# Patient Record
Sex: Male | Born: 1945 | Race: White | Hispanic: No | Marital: Single
Health system: Southern US, Community
[De-identification: ages and names within clinical notes are randomized; demographics above are authoritative.]

## PROBLEM LIST (undated history)

## (undated) DIAGNOSIS — Z9049 Acquired absence of other specified parts of digestive tract: Secondary | ICD-10-CM

## (undated) DIAGNOSIS — N529 Male erectile dysfunction, unspecified: Secondary | ICD-10-CM

## (undated) DIAGNOSIS — Z9889 Other specified postprocedural states: Secondary | ICD-10-CM

## (undated) DIAGNOSIS — M1990 Unspecified osteoarthritis, unspecified site: Secondary | ICD-10-CM

---

## 1999-03-20 ENCOUNTER — Encounter (INDEPENDENT_AMBULATORY_CARE_PROVIDER_SITE_OTHER): Payer: Self-pay | Admitting: Specialist

## 1999-03-20 ENCOUNTER — Other Ambulatory Visit: Admission: RE | Admit: 1999-03-20 | Discharge: 1999-03-20 | Payer: Self-pay | Admitting: Plastic Surgery

## 2004-03-01 ENCOUNTER — Emergency Department (HOSPITAL_COMMUNITY): Admission: EM | Admit: 2004-03-01 | Discharge: 2004-03-01 | Payer: Self-pay | Admitting: Emergency Medicine

## 2004-03-04 ENCOUNTER — Inpatient Hospital Stay (HOSPITAL_COMMUNITY): Admission: EM | Admit: 2004-03-04 | Discharge: 2004-03-09 | Payer: Self-pay | Admitting: Emergency Medicine

## 2004-03-04 ENCOUNTER — Ambulatory Visit: Payer: Self-pay | Admitting: Internal Medicine

## 2004-03-30 ENCOUNTER — Ambulatory Visit: Payer: Self-pay | Admitting: Internal Medicine

## 2004-04-13 ENCOUNTER — Ambulatory Visit: Payer: Self-pay | Admitting: Internal Medicine

## 2004-05-20 ENCOUNTER — Ambulatory Visit: Payer: Self-pay | Admitting: Internal Medicine

## 2005-11-19 ENCOUNTER — Ambulatory Visit (HOSPITAL_COMMUNITY): Admission: RE | Admit: 2005-11-19 | Discharge: 2005-11-19 | Payer: Self-pay | Admitting: *Deleted

## 2005-11-19 ENCOUNTER — Encounter (INDEPENDENT_AMBULATORY_CARE_PROVIDER_SITE_OTHER): Payer: Self-pay | Admitting: *Deleted

## 2006-05-19 ENCOUNTER — Inpatient Hospital Stay (HOSPITAL_COMMUNITY): Admission: RE | Admit: 2006-05-19 | Discharge: 2006-05-20 | Payer: Self-pay | Admitting: Orthopedic Surgery

## 2008-07-28 ENCOUNTER — Inpatient Hospital Stay (HOSPITAL_COMMUNITY): Admission: EM | Admit: 2008-07-28 | Discharge: 2008-07-29 | Payer: Self-pay | Admitting: Emergency Medicine

## 2008-08-06 ENCOUNTER — Ambulatory Visit (HOSPITAL_COMMUNITY): Admission: RE | Admit: 2008-08-06 | Discharge: 2008-08-07 | Payer: Self-pay | Admitting: Surgery

## 2009-07-20 ENCOUNTER — Emergency Department: Payer: Self-pay | Admitting: Emergency Medicine

## 2009-10-18 ENCOUNTER — Inpatient Hospital Stay: Payer: Self-pay | Admitting: Internal Medicine

## 2009-11-02 ENCOUNTER — Inpatient Hospital Stay: Payer: Self-pay | Admitting: Internal Medicine

## 2010-09-06 ENCOUNTER — Emergency Department: Payer: Self-pay | Admitting: Emergency Medicine

## 2010-10-06 LAB — CK TOTAL AND CKMB (NOT AT ARMC)
CK, MB: 5.9 ng/mL — ABNORMAL HIGH (ref 0.3–4.0)
Relative Index: 1.9 (ref 0.0–2.5)
Relative Index: 1.9 (ref 0.0–2.5)

## 2010-10-06 LAB — CBC
HCT: 44.2 % (ref 39.0–52.0)
MCHC: 34.1 g/dL (ref 30.0–36.0)
MCV: 92.1 fL (ref 78.0–100.0)
Platelets: 117 10*3/uL — ABNORMAL LOW (ref 150–400)
Platelets: 136 10*3/uL — ABNORMAL LOW (ref 150–400)
RBC: 5.07 MIL/uL (ref 4.22–5.81)
WBC: 10 10*3/uL (ref 4.0–10.5)

## 2010-10-06 LAB — RAPID URINE DRUG SCREEN, HOSP PERFORMED
Amphetamines: NOT DETECTED
Barbiturates: NOT DETECTED
Benzodiazepines: NOT DETECTED

## 2010-10-06 LAB — BASIC METABOLIC PANEL
BUN: 11 mg/dL (ref 6–23)
CO2: 25 mEq/L (ref 19–32)
Calcium: 10.2 mg/dL (ref 8.4–10.5)
Calcium: 8.8 mg/dL (ref 8.4–10.5)
Chloride: 102 mEq/L (ref 96–112)
Creatinine, Ser: 1.32 mg/dL (ref 0.4–1.5)
GFR calc Af Amer: >60 mL/min (ref >60)
GFR calc Af Amer: >60 mL/min (ref >60)
GFR calc non Af Amer: >60 mL/min (ref >60)
Potassium: 3.4 mEq/L — ABNORMAL LOW (ref 3.5–5.1)

## 2010-10-06 LAB — POCT CARDIAC MARKERS
CKMB, poc: 3.4 ng/mL (ref 1.0–8.0)
Myoglobin, poc: 9.6 ng/mL — ABNORMAL LOW (ref 12–200)
Troponin i, poc: <0.05 ng/mL (ref 0.00–0.09)

## 2010-10-06 LAB — PROTIME-INR: Prothrombin Time: 14.3 seconds (ref 11.6–15.2)

## 2010-10-06 LAB — DIFFERENTIAL
Basophils Relative: 1 % (ref 0–1)
Eosinophils Absolute: 0.3 10*3/uL (ref 0.0–0.7)
Eosinophils Relative: 3 % (ref 0–5)
Lymphs Abs: 2.1 10*3/uL (ref 0.7–4.0)
Monocytes Absolute: 0.5 10*3/uL (ref 0.1–1.0)
Neutrophils Relative %: 69 % (ref 43–77)

## 2010-10-06 LAB — TROPONIN I: Troponin I: <0.01 ng/mL (ref 0.00–0.06)

## 2010-10-06 LAB — ETHANOL: Alcohol, Ethyl (B): 7 mg/dL (ref 0–10)

## 2010-10-06 LAB — TSH: TSH: 0.89 u[IU]/mL (ref 0.350–4.500)

## 2010-11-03 NOTE — Op Note (Signed)
[name], [name]                ACCOUNT NO.:  [id number]   MEDICAL RECORD NO.:  [id number]          PATIENT TYPE:  INP   LOCATION:  0006                         FACILITY:  [facility]   PHYSICIAN:  Ardeth Sportsman, MD     DATE OF BIRTH:  [birth year]   DATE OF PROCEDURE:  DATE OF DISCHARGE:                               OPERATIVE REPORT   PRIMARY CARE PHYSICIAN:  Georgianne Fick, M.D.   SURGEON/:  Ardeth Sportsman, MD.   ASSISTANT:  None.   PREOPERATIVE DIAGNOSIS:  Recurrent ventral hernia.   POSTOPERATIVE DIAGNOSIS:  Recurrent ventral hernia (7 x 8 cm  incarcerated with omentum and old mesh).   PROCEDURE PERFORMED:  Laparoscopic lysis of adhesions times 60 minutes,  laparoscopic ventral hernia repair with mesh (a 20 x 25 Proceed equals  ultra-lightweight polypropylene/cellulose mesh).   ANESTHESIA:  1. General anesthesia.  2. Local anesthetic in a field block around port sites.   SPECIMENS:  None.   DRAINS:  None.   ESTIMATED BLOOD LOSS:  Less than 30 mL.   COMPLICATIONS:  None apparent.   INDICATIONS:  Mr. Tory is a 65 year old gentleman who underwent an open  Nissen fundoplication by Angelia Mould. Derrell Lolling, M.D. in [phone] and has had  good control of his reflux symptoms.  He developed an incisional hernia  and had an open repair done a few years later in 1993.  This past year  he noted bulging in the upper part of his incision and it has increased  in size and become uncomfortable for him.  He has evidence of incisional  hernia.   The anatomy and physiology of abdominal wall formation was discussed.  Pathophysiology of incisional herniation with its risks of  incarceration, strangulation and debilitating pain and function was  discussed.  Options were discussed and recommendation was made for  laparoscopic lysis of adhesions with ventral hernia repair.  The risks,  benefits and alternatives were discussed in detail.  Postoperative pain  issues were discussed in detail as  well.  Questions were answered and he  agreed to proceed.   OPERATIVE FINDINGS:  He had a 7 x 8 cm recurrent hernia in the upper  midline incision.  He had a piece of well-peritonealized polypropylene  mesh stuck in the hernia, consistent with a avulsion of part of the  repair.  He had primarily greater omentum, but also some transverse  colon up within the hernia sac.  There was no injury or other  abnormalities.  There was no evidence of any transition point or bowel  obstruction.   DESCRIPTION OF PROCEDURE:  Informed consent was confirmed.  The patient  received 2 grams of IV cefazolin just prior to surgery.  He had  sequential pressure devices active during the entire case.  He underwent  general anesthesia without any difficulty.  He had a Foley catheter  sterilely placed.  His was positioned supine with both arms tucked.  His  abdomen and perineal region were clipped, prepped and draped in the  sterile fashion.   A 5 mm port was placed in the left  upper quadrant using optical entry  technique with the patient in steep reverse Trendelenburg and left-side  up.  Camera inspection revealed no intraabdominal injury.  Under direct  visualization a 10 mm port was placed in the right flank and another one  was placed in the right flank.   The patient had moderately wide wad of greater omentum and transverse  colon stuck in his upper abdomen.  This was carefully freed up by doing  some gentle sweeping of some thin wispy adhesions and sharp dissection,  primarily with cold scissors and occasionally with some focused cautery  when only the greater omentum was involved.  I did this and was able to  come around and find a hernia sac.  There was some dense adhesions of  primarily greater omentum up in the hernia sac near the obvious piece of  polypropylene mesh.  Eventually, I was able to free all that down.  I  ended up having freeing the liver edge, primarily the left hepatic lobe  off  the anterior parietal peritoneum that helped peel that down and  helped free up  anterior hepatic ligament as well.  Hemostasis was  assured.   The defect was measured out to be around 7 x 8 cm.  I choose a 20 x 25  cm mesh since it was a recurrence with mesh.  I ended up placing #1  Novofil stitches x10 around the edges of the rough side of the mesh.  The mesh was rolled rough side in and placed in the abdomen and  unrolled.  It was secured to the anterior abdominal wall using a  laparoscopic suture passer to get good full bites of the abdominal wall.  Care was made to not entrap any intercostal nerves.  Coverage was over 5  cm circumferentially around the hernia sac and the area was tacked down.  A tacker was used to secure the rough edges of the mesh to the anterior  abdominal wall.  There was no exposed abnormal mesh.   Camera inspection was done and hemostasis was excellent.  Capnoperitoneum was evacuated.  The ports were removed.  Please note  that the 10 mm port site had to be dilated to allow the mesh in and  therefore the defect was closed using #1 Novofil stitch figure-of-eight  x2 to help close the area down.  Capnoperitoneum was evacuated and  the ports were removed.  The skin was closed using 4-0 Monocryl stitch.  Sterile dressings were applied.  The patient was extubated and sent to  the recovery room in stable condition.   I discussed postoperative care with the patient in detail and I am about  to discuss it with his family.      Ardeth Sportsman, MD  Electronically Signed     SCG/MEDQ  D:  08/06/2008  T:  08/06/2008  Job:  [id number]   cc:   Angelia Mould. Derrell Lolling, M.D.  [address], [address]  [place]  [place] [address]   Georgianne Fick, M.D.  Fax: [phone]

## 2010-11-03 NOTE — H&P (Signed)
[name], [name]                ACCOUNT NO.:  [id number]   MEDICAL RECORD NO.:  [id number]          PATIENT TYPE:  INP   LOCATION:  2013                         FACILITY:  [facility]   PHYSICIAN:  Della Goo, M.D. DATE OF BIRTH:  [birth year]   DATE OF ADMISSION:  07/28/2008  DATE OF DISCHARGE:                              HISTORY & PHYSICAL   PRIMARY CARE PHYSICIAN:  Dr. Nicholos Johns.   CHIEF COMPLAINT:  Weakness, lightheadedness.   HISTORY OF PRESENT ILLNESS:  This is a 65 year old male who was brought  to the emergency department after almost passing out.  He states that he  began to feel weak at about 4 p.m. in the afternoon when he got up from  a chair and when he stood up he felt dizzy, so he sat back down and  rested for a while.  He then tried to get up again and was still dizzy  and feeling as if he would pass out.  He stated that he continued to  rest in the afternoon and took a nap and stated that when he got up  later he decided he was going to go out and went out to a club.  And  when he was leaving the club at about 10:00 p.m. he got up to leave and  almost passed out again.  The patient reported to the emergency  department physician that he had taken a Viagra tablet at about noon.  He states he also had two beers prior to going to the club.  The patient  stated that he also had symptoms of chest discomfort and diaphoresis  associated with the lightheadedness and also had nausea but no vomiting.  The EMS was called to the club, and the patient was taken to the  emergency department at [facility].  He was given 325 mg of  aspirin en route.   The patient reports having a similar episode many, many years ago in the  70's and states that he will went to the hospital at that time and the  cause was not found for the episode.  However, he states he never had  any further symptoms up until now.   PAST MEDICAL HISTORY:  History of hypoglycemia, osteoarthritis,  erectile  dysfunction, ventral hernia.   PAST SURGICAL HISTORY:  History of a Nissen fundoplication, which was  performed in 1993 secondary to severe reflux and vomiting.  Following  that surgery the patient developed a ventral hernia and had a ventral  hernia repair with mesh placement.  He is also scheduled to have a  repeat repair secondary to recurrence and failure of the mesh  reinforcement.  The patient has also had a right shoulder arthroplasty  in 2008, and appendectomy and a tonsillectomy and adenoidectomy.   MEDICATIONS:  Include trazodone 150 mg p.o. q.h.s., Viagra 100 mg p.o.  daily p.r.n., and Mobic 15 mg p.o. daily.   ALLERGIES:  NO KNOWN DRUG ALLERGIES.   SOCIAL HISTORY:  Patient is a smoker.  He reports quitting for  approximately 10 years and prior to that he had  smoked one pack per day  since the age of 89 and following quitting for 10 years he states he  began smoking again one pack per day up until the present.  Alcohol  usage:  He reports drinking 02/03 beers daily.   FAMILY HISTORY:  No coronary artery disease.  No hypertension.  No  diabetic disease. No cancers in the family that he knows of.   REVIEW OF SYSTEMS:  Pertinent as mentioned above.   PHYSICAL EXAMINATION FINDINGS:  GENERAL APPEARANCE:  This is a 62-year-  old well-nourished, well-developed male in no visible discomfort or  acute distress.  INITIAL VITAL SIGNS:  Temperature 97.1, blood pressure 93/53, heart rate  71, respirations 20, and O2 saturations 95-100%.  HEENT: Normocephalic, atraumatic.  Pupils are equally round and reactive  to light.  Extraocular movements are intact. Funduscopic benign.  There  is no scleral icterus.  There is scleral erythema.  Tympanic membranes  are clear bilaterally.  Nares are patent bilaterally.  Oropharynx is  clear.  No exudates or erythema. The neck is supple with full range of  motion.  No thyromegaly, adenopathy or jugular venous distention.   CARDIOVASCULAR: Regular rate and rhythm.  No murmurs, gallops or rubs  appreciated.  LUNGS: Clear to auscultation bilaterally.  No rales, rhonchi or wheezes.  ABDOMEN:  Two large linear scars. One scar is a surgical midline well-  healed area. There is a muscular defect just superior to the umbilicus.  Also there is another surgical scar which is adjacent and parallel to  this area which goes across the right lower quadrant area.  Positive  bowel sounds, soft, nontender, nondistended.  No hepatosplenomegaly.  EXTREMITIES: Without cyanosis, clubbing or edema.  NEUROLOGIC: Alert and oriented x3.  There are no focal deficits.   LABORATORY STUDIES:  White blood cell count 10, hemoglobin 15.9,  hematocrit 46.6, platelets 136, neutrophils 69%, lymphocytes 21%.  Sodium 139, potassium 3.8, chloride 102, bicarb 29, BUN 17, creatinine  1.32, glucose 161.  Chest x-ray reveals bibasilar atelectasis, and a  right humeral prosthesis is present.  EKGs performed in the emergency  department revealed a sinus rhythm.  However, both EKGs reveal a first-  degree AV block and the second EKG reveals also a Mobitz type 1 AV  block.   ASSESSMENT:  A 65 year old male being admitted with:  1. Presyncope.  2. Hypotension, which was possibly aggravated by Viagra.  3. Chest pain.  4. Arteriovenous block.  5. Hyperglycemia.  6. Mild thrombocytopenia.  7. Mild hyperglycemia.   PLAN:  The patient will be admitted to telemetry area for cardiac  monitoring.  Cardiac enzymes will be performed.  The patient has  received 2 liters of IV fluids in the emergency department and will  continue to receive fluids for fluid resuscitation.  Orthostatic vital  signs will be checked q.a.m.  His regular medications will not be  continued at this time.  The effects of the Viagra on his blood pressure  have been discussed with the patient.  But further evaluations will be  performed to evaluate for a possible cause of his  presyncope.  The  patient will be placed on DVT and GI prophylaxis.      Della Goo, M.D.  Electronically Signed    HJ/MEDQ  D:  07/28/2008  T:  07/28/2008  Job:  [id number]   cc:   [name], M.D.

## 2010-11-03 NOTE — Discharge Summary (Signed)
NAMECASHIUS, [name] NO.:  [id number]   MEDICAL RECORD NO.:  [id number]          PATIENT TYPE:  INP   LOCATION:  2013                         FACILITY:  [facility]   PHYSICIAN:  Eduard Clos, MDDATE OF BIRTH:  Apr 16, 1946   DATE OF ADMISSION:  07/28/2008  DATE OF DISCHARGE:  07/29/2008                               DISCHARGE SUMMARY   COURSE IN THE HOSPITAL:  A 65 year old male with history of arthralgia  was brought into ER when patient had an episode of dizziness.  The  patient had been to a pub and a few hours before that he had taken some  Viagra and alcohol.  The patient was admitted to the medical floor.  EKG  and cardiac enzymes were done.  EKG was showing positive  and cardiac  enzymes were within acceptable limits.  The patient was positive for  marijuana.  At this time, the patient's symptoms have greatly resolved.  There was no further episode of dizziness.  Cardiology was consulted.  As per Cardiology, no further workup was necessary and to have stress  test done as an outpatient on July 30, 2008, at their office.  The  patient was advised not to drive until then and to be off alcohol,  cigarette smoking, and Viagra.  At the time of this dictation, the  patient was noted to be stable .   1. Near syncope.  2. Arthralgia.  3. History of ventral hernia.   MEDICATIONS AT DISCHARGE:  1. Trazodone 50 mg p.o. at bedtime p.r.n.  2. Mobic 15 mg p.o. daily p.r.n. for arthralgia.  3. Omeprazole 40 mg p.o. daily.   The patient was advised not to take Viagra, alcohol, smoking cigarettes,  or using marijuana.  The patient was advised to follow up with [facility] on July 30, 2008, for his scheduled stress test.  He is to be off driving until cleared by Cardiology, to follow up with  Dr. Nicholos Johns, his primary care physician, within a week and recheck  his BMET.  Activity as tolerated.      Eduard Clos, MD  Electronically Signed     ANK/MEDQ  D:  07/29/2008  T:  07/30/2008  Job:  [id number]   cc:   Georgianne Fick, M.D.

## 2010-11-06 NOTE — Op Note (Signed)
NAMEDECARI, DUGGAR                ACCOUNT NO.:  [id number]   MEDICAL RECORD NO.:  [id number]          PATIENT TYPE:  INP   LOCATION:  5038                         FACILITY:  [facility]   PHYSICIAN:  Vania Rea. Supple, M.D.  DATE OF BIRTH:  July 22, 1945   DATE OF PROCEDURE:  05/19/2006  DATE OF DISCHARGE:                               OPERATIVE REPORT   PREOP DIAGNOSIS:  End-stage right shoulder glenohumeral joint  osteoarthrosis.   POSTOP DIAGNOSIS:  End-stage right shoulder glenohumeral joint  osteoarthrosis.   PROCEDURE:  Right shoulder Press-Fit hemiarthroplasty utilizing a DePuy  global stem size 12 and a 56 x 18 offset humeral head.   SURGEON OF RECORD:  Vania Rea. Supple, M.D.   Threasa HeadsFrench Ana A. Shuford, P.A.-C.   ANESTHESIA:  General endotracheal as well as her preop interscalene  block.   ESTIMATED BLOOD LOSS:  400 mL.   DRAINS:  None.   HISTORY:  Mr. Maione is a 65 year old gentleman who has had chronic right  shoulder pain secondary to osteoarthrosis with now significant increase  in functional limitations.  Due to his longstanding symptoms and  increasing pain and functional limitations, he is brought to the  operating room, at this time, for planned right shoulder  hemiarthroplasty.   Preoperatively I counseled Mr. Clayborn on treatment options as well as  risks versus benefits thereof.  Possible surgical complications of  bleeding, infection, neurovascular injury, persistence of pain, loss of  motion, loosening of the implant, and possible need for revision surgery  were all reviewed.  He understands, accepts, and agrees with our planned  procedure.   PROCEDURE IN DETAIL:  After undergoing routine preop evaluation, the  patient received prophylactic antibiotics.  An interscalene block was  established in the holding area by the anesthesia department.  Patient  placed supine on the operating table, and underwent smooth induction of  a general endotracheal  anesthesia.  The right shoulder girdle region was  then sterilely prepped and draped in standard fashion.  A standard  anterior approach to the right shoulder was made through a deltopectoral  interval, with an incision beginning just proximal to the coracoid  process and extending laterally and distally to a length of  approximately 20 cm.  The skin was sharply divided.  The deltopectoral  interval was identified; and the cephalic vein was carefully protected,  and retracted laterally with the deltoid.   I performed a small tenotomy in the upper margin of the pectoralis  major.  The conjoined tendon was then identified and a self-retaining  retractor was then placed to retract the conjoined tendon medially, and  the deltoid laterally.  The biceps tendon was then identified, and then  the bicipital sheath was divided; and the biceps tendon was then  unroofed; and this allowed visualization of the insertion of the  subscapularis which was sharply divided away from its insertion into the  lesser tuberosity; and the free margin was then tacked with a series of  #2 FiberWire sutures.  The anterior circumflex vessels were then  identified and divided with electrocautery.  This allowed  free  mobilization of the subscapularis medially.  We protected the contents  of the axillary pouch; and then dissected the capsule away from the  inferior margin of the humeral head dissecting it away from very large  inferior osteophytes; and the osteophytes were then removed with a  combination of rongeur and osteotome.   The subscapularis was then placed behind the retractor and retracted  medially.  This allowed visualization of the glenoid articular surfaces  which were, actually, in quite good condition.  The biceps tendon was  then tenotomized to the superior glenoid; and then we sharply removed  the remaining aspects of the glenoid labrum.  The anterior capsule was  divided to allow mobilization of the  subscapularis.  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  We meticulously  debrided the capsule anteriorly, inferiorly, and posteriorly.   Rotator cuff was noted be intact superiorly and posteriorly.  This was  carefully protected.  The humeral head was then exposed; and then using  an extramedullary guide, we made the osteotomy across the humeral head  with an oscillating saw with care taken to protect the rotator cuff.  The head segment was then removed and the size was between 56 x 15 and  56 x 18.  The humeral head was then delivered through the wound; and we  used an intramedullary drill to gain access to the humeral canal, and  then performed sequential hand reaming up to size 12.  We then placed  the initial box cutting guide on the face of the humeral head cut, and  determined proper degree of retroversion; and then box cut the humerus.  We then performed sequential broachings up to a size 12.  Excellent fit  was achieved.  We then performed a trial reduction with a 56 x 15 and 56  x 18 heads and the 56 x 18 with an offset provided the best coverage of  the proximal humerus.   The trial components were then removed.  The canal was irrigated; and we  placed a series of bone tunnels through the anterior humerus for later  repair of the subscapularis.  The final implant was then impacted into  position.  We did place some bone graft proximally around the humeral  stem.  Excellent impaction fit was achieved.  The final humeral head  implant 56 x 18 offset, was then placed into position.  Proper location  of the head was dialed in; and then it was impacted into position.  Final reduction was performed.  There was excellent shoulder motion  with good soft tissue balance.  We then irrigated the joint, obtained  hemostasis.  The subscapularis was then completely and circumferentially  mobilized; and then repaired through bone tunnels to the lesser  tuberosity.  We then performed a biceps tenodesis to the suture limbs  from the subscapularis repair.  At this point, final inspection and  irrigation was completed.  The deltopectoral interval was then allowed  to close.  Then 2-0 Vicryl was then used to close the subcutaneous layer  and intracuticular 3-0 Monocryl was used to close the skin; followed by  Steri-Strips.  A dry dressing was placed over the right shoulder; and  the right arm placed in a sling  immobilizer.  The patient was then  extubated, awakened, and taken to the recovery room in stable position.      Vania Rea. Supple, M.D.  Electronically Signed     KMS/MEDQ  D:  05/19/2006  T:  05/19/2006  Job:  [id number]

## 2010-11-06 NOTE — Op Note (Signed)
NAMEKEVIN, SPACE                ACCOUNT NO.:  [id number]   MEDICAL RECORD NO.:  [id number]          PATIENT TYPE:  AMB   LOCATION:  ENDO                         FACILITY:  [facility]   PHYSICIAN:  Georgiana Spinner, M.D.    DATE OF BIRTH:  1946-01-25   DATE OF PROCEDURE:  11/19/2005  DATE OF DISCHARGE:                                 OPERATIVE REPORT   PROCEDURE:  Colonoscopy with biopsy.   INDICATIONS:  Colon polyp.   ANESTHESIA:  Demerol 50, Versed 7 mg.   PROCEDURE:  With the patient mildly sedated in the left lateral decubitus  position, a rectal examination was performed which was unremarkable.  Subsequently the Olympus videoscopic colonoscope was inserted into the  rectum and passed under direct vision through a slightly tortuous sigmoid  colon.  We reached the cecum, identified by ileocecal valve and appendiceal  orifice, both of which were photographed. From this point the colonoscope  was slowly withdrawn taking circumferential views of colonic mucosa stopping  first at the hepatic flexure where a lipoma that was approximately 2 cm in  size was seen photographed only.  The endoscope was then further withdrawn  taking circumferential views of remaining colonic mucosa stopping then only  in the rectum at the anal verge where a polyp was seen. It was approximately  7 mm to 8 mm in size.  It was photographed and was removed using hot biopsy  forceps technique, setting of 20/200 blended current.  The endoscope was  then placed in retroflexion to view the anal canal from above.  Internal  hemorrhoids and the site of the previous polyp were both noted.  The  endoscope was straightened and withdrawn.  The patient's vital signs, pulse  oximeter remained stable.  The patient tolerated procedure well without  apparent complication.   FINDINGS:  Lipoma of hepatic flexure area, polyp of rectum near anal verge.   PLAN:  Await biopsy report.  The patient call me for results and follow-up  with me as an outpatient.           ______________________________  Georgiana Spinner, M.D.     GMO/MEDQ  D:  11/19/2005  T:  11/19/2005  Job:  [id number]

## 2010-11-06 NOTE — Discharge Summary (Signed)
[name], SCHUBRING                ACCOUNT NO.:  [id number]   MEDICAL RECORD NO.:  [id number]          PATIENT TYPE:  INP   LOCATION:  5740                         FACILITY:  [facility]   PHYSICIAN:  Alvester Morin, M.D.  DATE OF BIRTH:  12-26-1945   DATE OF ADMISSION:  03/04/2004  DATE OF DISCHARGE:  03/09/2004                                 DISCHARGE SUMMARY   DISCHARGE DIAGNOSES:  1.  Methicillin-resistant Staphylococcus aureus cellulitis.  2.  Osteoarthritis.   DISCHARGE MEDICATIONS:  1.  Acetaminophen 325 mg two tablets q.4h. p.r.n. pain.  2.  Mobic 15 mg daily.  3.  Doxycycline 100 mg b.i.d.   DISPOSITION:  Patient discharged to home with appointment scheduled in  outpatient clinic with Dr. [name] on October 10 at 9 a.m.  He is to have a  CBC with differential drawn at that time.   PROCEDURES:  1.  PICC placement.  2.  CT scan that was negative for abscess, showing cellulitis.   CONSULTS:  1.  Surgery, Dr. Gerrit Friends  2.  Nurse case manager   HISTORY AND PHYSICAL:  This is a 65 year old man with no significant past  medical history who presents with left leg erythema and pain.  He reports  noticing a red dot on his left upper thigh nine days prior to admission.  Within a few days he reported that there was a black dot in the center of  the red area.  At that point there was itching.  Four days prior to  admission the patient presented to an ER in [place] where he was given  __________.  The next day the erythema had spread and the area was painful.  He went to the ER at [facility] where the area was drained and received  __________.  The pain was worse one day prior to admission so patient  reported to dermatologist who took a culture and put the patient on  Levaquin.  The culture is pending but the Gram stain showed few white blood  cells, moderate gram-positive cocci in clusters, and rare squamous  epithelial cells.  The patient reports fever increasing to 102.8 and  chills.  He denies any trauma to his leg, any cough, any pleuritic chest pain.  He  also denies having been in any wooded area.   PHYSICAL EXAMINATION:  VITAL SIGNS:  Temperature 100.8, pulse 100,  respiratory rate 22, blood pressure 120/75.  He was saturating 96% on room  air.  GENERAL:  No acute distress, sleepy.  HEENT:  Eyes:  Pupils were equally round and reactive to light.  Extraocular  movement intact.  NECK:  Supple.  No thyromegaly.  No JVD.  No bruits.  RESPIRATORY:  Clear to auscultation bilaterally.  CARDIOVASCULAR:  Regular rate and rhythm.  No murmurs, rubs, or gallops.  GASTROINTESTINAL:  Soft, plus bowel sounds, nontender, nondistended with  left lower quadrant tender to palpation, but no rebound or guarding.  SKIN:  Patient had left thigh erythema from his waist to his mid leg.  There  was increased warmth with margins not well demarcated.  There was a bandage  at his upper lateral thigh draining a serosanguineous fluid.  Medial to the  bandage was a cluster of white papules.  The area of erythema blanches.  Patient also had a vertically midline abdominal scar and a left lower  abdominal horizontal scar.  LYMPH:  Patient with painful and firm inguinal lymphadenopathy on his left  side.  No submandibular or supraclavicular adenopathy.  NEUROLOGIC:  Alert and oriented x3.  Babinski was negative.   ADMISSION LABORATORIES:  Patient with a white count of 16.1, hemoglobin and  hematocrit 15.2 and 44.4, platelets 181, absolute neutrophil count 13.1, MCV  89.  CMET:  Sodium 137, potassium 4.1, chloride 102, bicarbonate 27, BUN 15,  creatinine 1.3, glucose 81.  Patient had a bilirubin of 1, alkaline  phosphatase of 80, AST of 46, ALT of 52, protein 7.5, albumin 3.7, calcium  10.   HOSPITAL COURSE:  PROBLEM 1 - CELLULITIS:  Patient was started on vancomycin  secondary to concerns of MRSA and wound cultures as well as blood cultures  were taken.  During his course his white  blood count steadily declined and  he was afebrile.  His wound had increased drainage and surgery was consulted  secondary to concerns of an abscess.  Dr. Gerrit Friends did not feel that it was an  abscess or that there was anything to I&D.  Patient continued to improve  clinically with decreasing erythema, warmth, and drainage.  His culture  results came back and MRSA was confirmed with sensitivities to vancomycin,  levofloxacin, and tetracycline.  Patient was discontinued on vancomycin as  well as had PICC discontinued and begun on oral doxycycline at discharge.   PROBLEM 2 - ELEVATED LIVER FUNCTION TESTS:  Resolved.  Probably secondary to  infection.   DISCHARGE LABORATORIES:  Patient's CBC was as follows:  His white count was  8.8, hemoglobin 11.8 with hematocrit of 34.2, platelets 189, MCV 88.1.       GB/MEDQ  D:  03/10/2004  T:  03/10/2004  Job:  [id number]   cc:   Velora Heckler, M.D.  [address] [place]  [place] [address]  Fax: [phone]   Outpatient Clinic

## 2010-11-06 NOTE — Consult Note (Signed)
NAME:  [name], [name]                          ACCOUNT NO.:  [id number]   MEDICAL RECORD NO.:  [id number]                   PATIENT TYPE:  INP   LOCATION:  5740                                 FACILITY:  [facility]   PHYSICIAN:  Velora Heckler, M.D.                DATE OF BIRTH:  12/01/1945   DATE OF CONSULTATION:  03/05/2004  DATE OF DISCHARGE:                                   CONSULTATION   REFERRING PHYSICIAN:  Medical teaching service, Dr. Harriett Sine Phifer attending   REASON FOR CONSULTATION:  Left lower extremity cellulitis with associated  wound.   HISTORY OF PRESENT ILLNESS:  Patient is a 65 year old white male admitted on  the medical teaching service from the emergency department with cellulitis  left lower extremity.  Patient was in his normal state of good health until  10 days ago when he noted a small area on the left lateral upper thigh.  This was initially read in color.  He had progressed to a dark brown/black  area with ulceration.  He was traveling in [place] at the time and was  seen in an emergency room where he was started on antibiotics.  He returned  to the emergency department where it was noted to have increased redness and  increased tenderness.  Patient returned to [facility] and was seen in the  emergency department at [facility].  He underwent incision and  drainage in the emergency department and was started on some type of Sulfa  antibiotic.  Patient failed to improve and was seen by dermatologist.  Dermatologist then referred the patient to [facility] where he was admitted  on the medical teaching service on March 04, 2004.  Patient was then  started on intravenous vancomycin.  Further cultures were taken from the  wound.  Gram stain showed gram-positive cocci in clusters.  General surgery  is now consulted for consideration for incision and drainage if needed.   PAST MEDICAL HISTORY:  1.  Hypoglycemia.  2.  Osteoarthritis.  3.  Status  post appendectomy.  4.  Status post nissen fundoplication in 1993 by Dr. Claud Kelp.  5.  Status post ventral hernia repair.  6.  History of traumatic fracture of the pelvis 1995.   MEDICATIONS:  Mobic.   ALLERGIES:  None known.   SOCIAL HISTORY:  Patient is married.  He does concrete work.  He is  accompanied by his wife.  He smokes a pack of cigarettes a day.  He drinks  alcohol on occasion.   REVIEW OF SYSTEMS:  15-system reviewed and documented in the medical record  and reviewed with the patient.  No other significant positives noted.   PHYSICAL EXAMINATION:  GENERAL:  A 65 year old white male on [address] [facility].  No acute distress.  VITAL SIGNS:  Temperature 97.4, pulse 66, respirations 20, blood pressure  95/48, O2 saturation 95% room air.  HEENT:  Normocephalic.  Sclerae are clear.  Conjunctivae are clear.  Dentition is fair.  Mucous membranes are moist.  Voice is normal.  EXTREMITIES:  Examination of the left lower extremity shows extensive  cellulitis from the inguinal crease extending to the mid thigh anteriorly.  This also extends medially near the scrotum and laterally to the greater  trochanter.  There is an open draining wound laterally in the proximal  thigh.  It measures approximately 1 cm in diameter.  It has brown purulent  drainage present on the dressing.  Dressing is changed.  There is palpable  lymphadenopathy in the left groin which is moderately tender.  There is  marked induration.  There is no fluctuance.   LABORATORIES:  White count 12.3, hemoglobin 12.5, platelet count 176,000.  Electrolytes are normal.  Liver function tests are normal.  Cultures and  microbiology are pending.   Radiographic studies:  CT scan left lower extremity performed this morning  shows edema and inflammatory changes throughout the left proximal thigh with  thickening of the skin.  There is no drainable abscess identified.   IMPRESSION:  Probable  methicillin-resistant Staphylococcus aureus infection  of left lower extremity with resultant cellulitis.  No evidence of drainable  abscess.   PLAN:  1.  Continue local wound care.  2.  Intravenous vancomycin.  3.  No role for surgical intervention at present.  Will follow closely with      you.                                               Velora Heckler, M.D.    TMG/MEDQ  D:  03/05/2004  T:  03/06/2004  Job:  [id number]

## 2011-02-03 ENCOUNTER — Emergency Department (HOSPITAL_COMMUNITY): Payer: BC Managed Care – PPO

## 2011-02-03 ENCOUNTER — Emergency Department (HOSPITAL_COMMUNITY)
Admission: EM | Admit: 2011-02-03 | Discharge: 2011-02-03 | Disposition: A | Discharge status: Home / Self Care | Payer: BC Managed Care – PPO | Attending: Emergency Medicine | Admitting: Emergency Medicine

## 2011-02-03 ENCOUNTER — Encounter (HOSPITAL_COMMUNITY): Payer: Self-pay

## 2011-02-03 DIAGNOSIS — S02109A Fracture of base of skull, unspecified side, initial encounter for closed fracture: Secondary | ICD-10-CM | POA: Insufficient documentation

## 2011-02-03 DIAGNOSIS — S022XXA Fracture of nasal bones, initial encounter for closed fracture: Secondary | ICD-10-CM | POA: Insufficient documentation

## 2011-02-03 DIAGNOSIS — M129 Arthropathy, unspecified: Secondary | ICD-10-CM | POA: Insufficient documentation

## 2011-02-03 DIAGNOSIS — R221 Localized swelling, mass and lump, neck: Secondary | ICD-10-CM | POA: Insufficient documentation

## 2011-02-03 DIAGNOSIS — R220 Localized swelling, mass and lump, head: Secondary | ICD-10-CM | POA: Insufficient documentation

## 2011-02-03 DIAGNOSIS — S0280XA Fracture of other specified skull and facial bones, unspecified side, initial encounter for closed fracture: Secondary | ICD-10-CM | POA: Insufficient documentation

## 2011-02-03 DIAGNOSIS — S0003XA Contusion of scalp, initial encounter: Secondary | ICD-10-CM | POA: Insufficient documentation

## 2011-02-03 DIAGNOSIS — M79609 Pain in unspecified limb: Secondary | ICD-10-CM | POA: Insufficient documentation

## 2011-02-03 DIAGNOSIS — R51 Headache: Secondary | ICD-10-CM | POA: Insufficient documentation

## 2011-02-03 DIAGNOSIS — M542 Cervicalgia: Secondary | ICD-10-CM | POA: Insufficient documentation

## 2011-02-03 DIAGNOSIS — IMO0002 Reserved for concepts with insufficient information to code with codable children: Secondary | ICD-10-CM | POA: Insufficient documentation

## 2011-02-03 DIAGNOSIS — S0083XA Contusion of other part of head, initial encounter: Secondary | ICD-10-CM | POA: Insufficient documentation

## 2011-02-03 HISTORY — DX: Male erectile dysfunction, unspecified: N52.9

## 2011-02-03 HISTORY — DX: Acquired absence of other specified parts of digestive tract: Z90.49

## 2011-02-03 HISTORY — DX: Unspecified osteoarthritis, unspecified site: M19.90

## 2011-02-03 HISTORY — DX: Other specified postprocedural states: Z98.890

## 2011-02-03 LAB — POCT I-STAT, CHEM 8
Chloride: 102 mEq/L (ref 96–112)
Glucose, Bld: 127 mg/dL — ABNORMAL HIGH (ref 70–99)
HCT: 52 % (ref 39.0–52.0)
Hemoglobin: 17.7 g/dL — ABNORMAL HIGH (ref 13.0–17.0)
Potassium: 4.1 mEq/L (ref 3.5–5.1)

## 2011-02-18 ENCOUNTER — Ambulatory Visit: Payer: Self-pay | Admitting: Otolaryngology

## 2011-10-14 ENCOUNTER — Ambulatory Visit: Payer: Self-pay | Admitting: Otolaryngology

## 2011-10-18 LAB — PATHOLOGY REPORT

## 2012-06-21 DEATH — deceased

## 2012-11-19 IMAGING — CR DG CHEST 2V
2 series · 2 of 2 positions shown · non-contrast
Comparison: 07/28/2008

CLINICAL DATA: Trauma, rolled a lawnmower, neck and face pain,
smoker, hypertension

CHEST - 2 VIEW

[w chest pa]
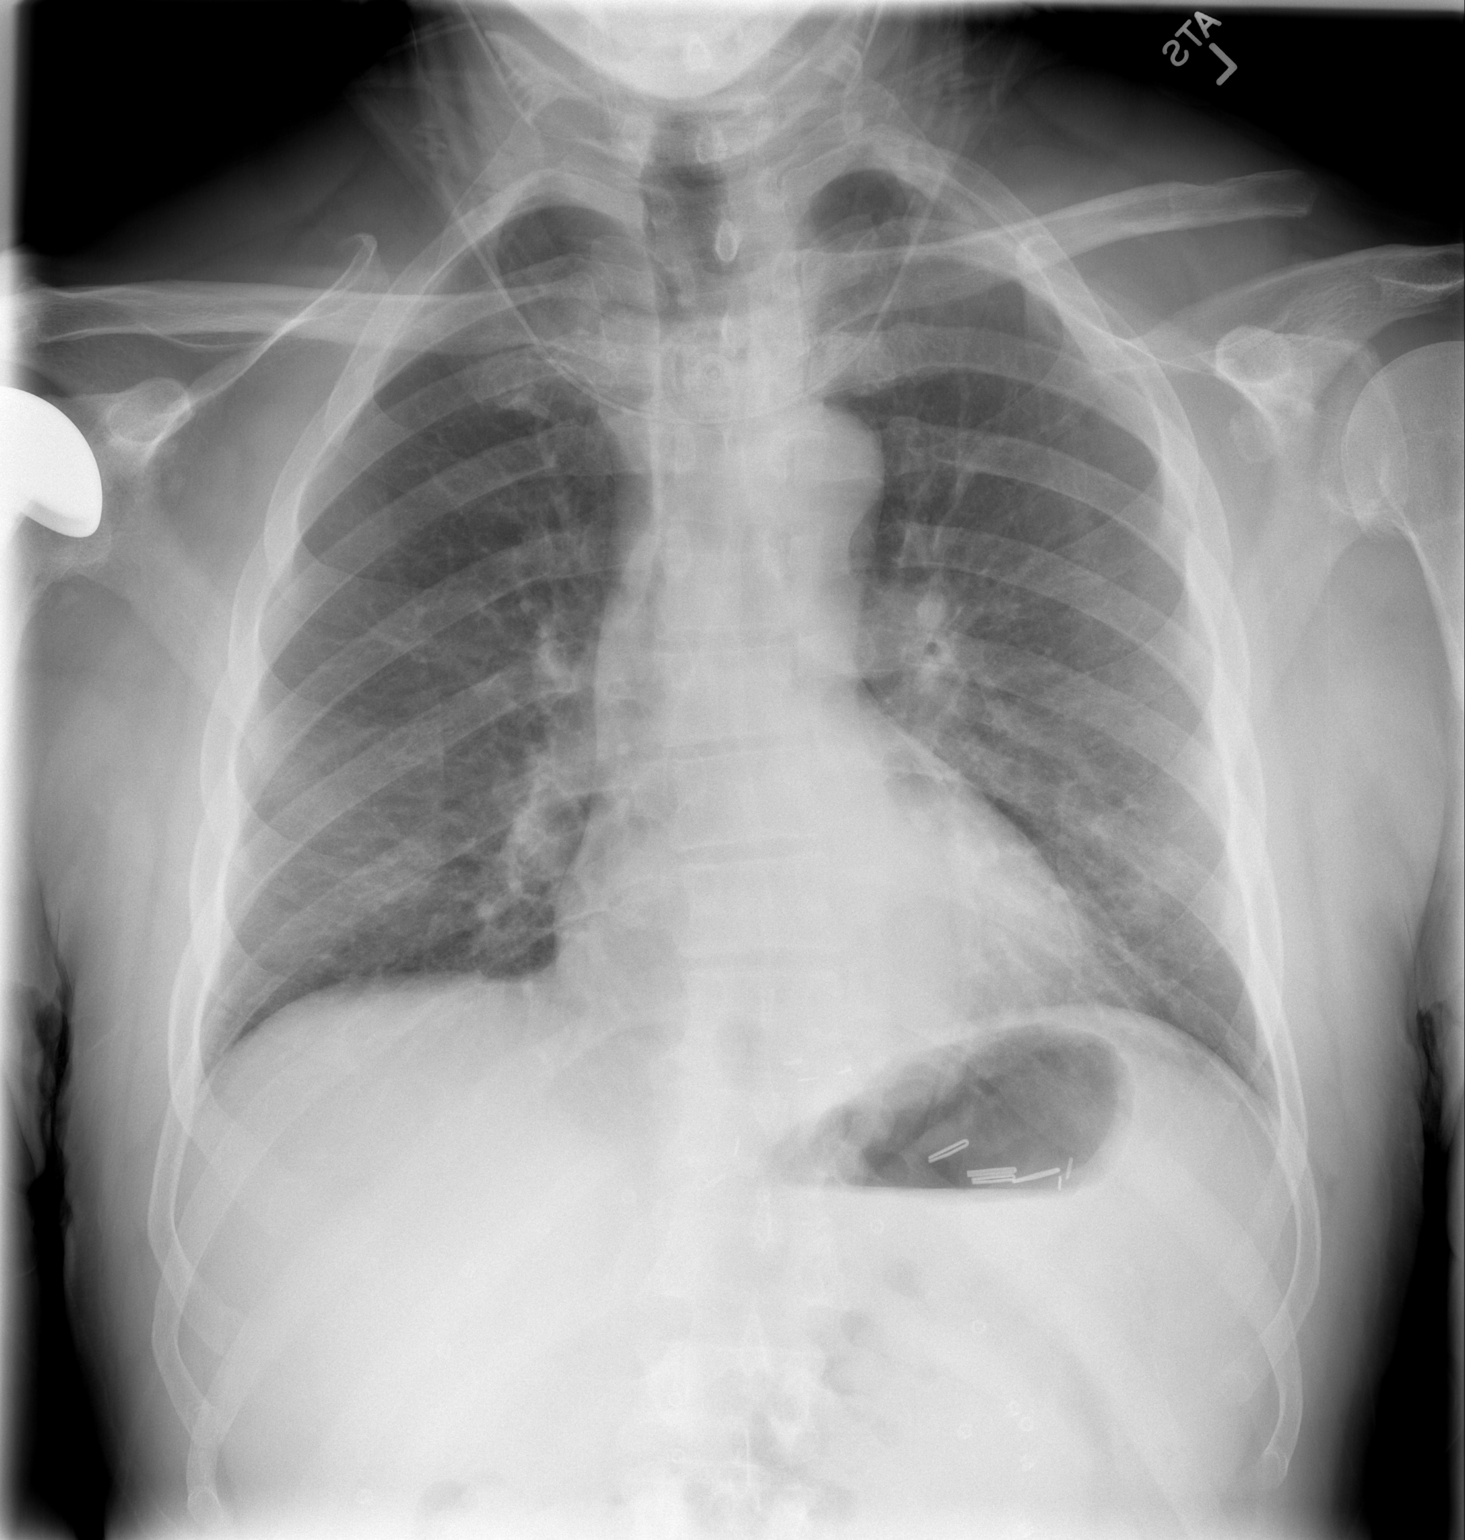

[w chest lat]
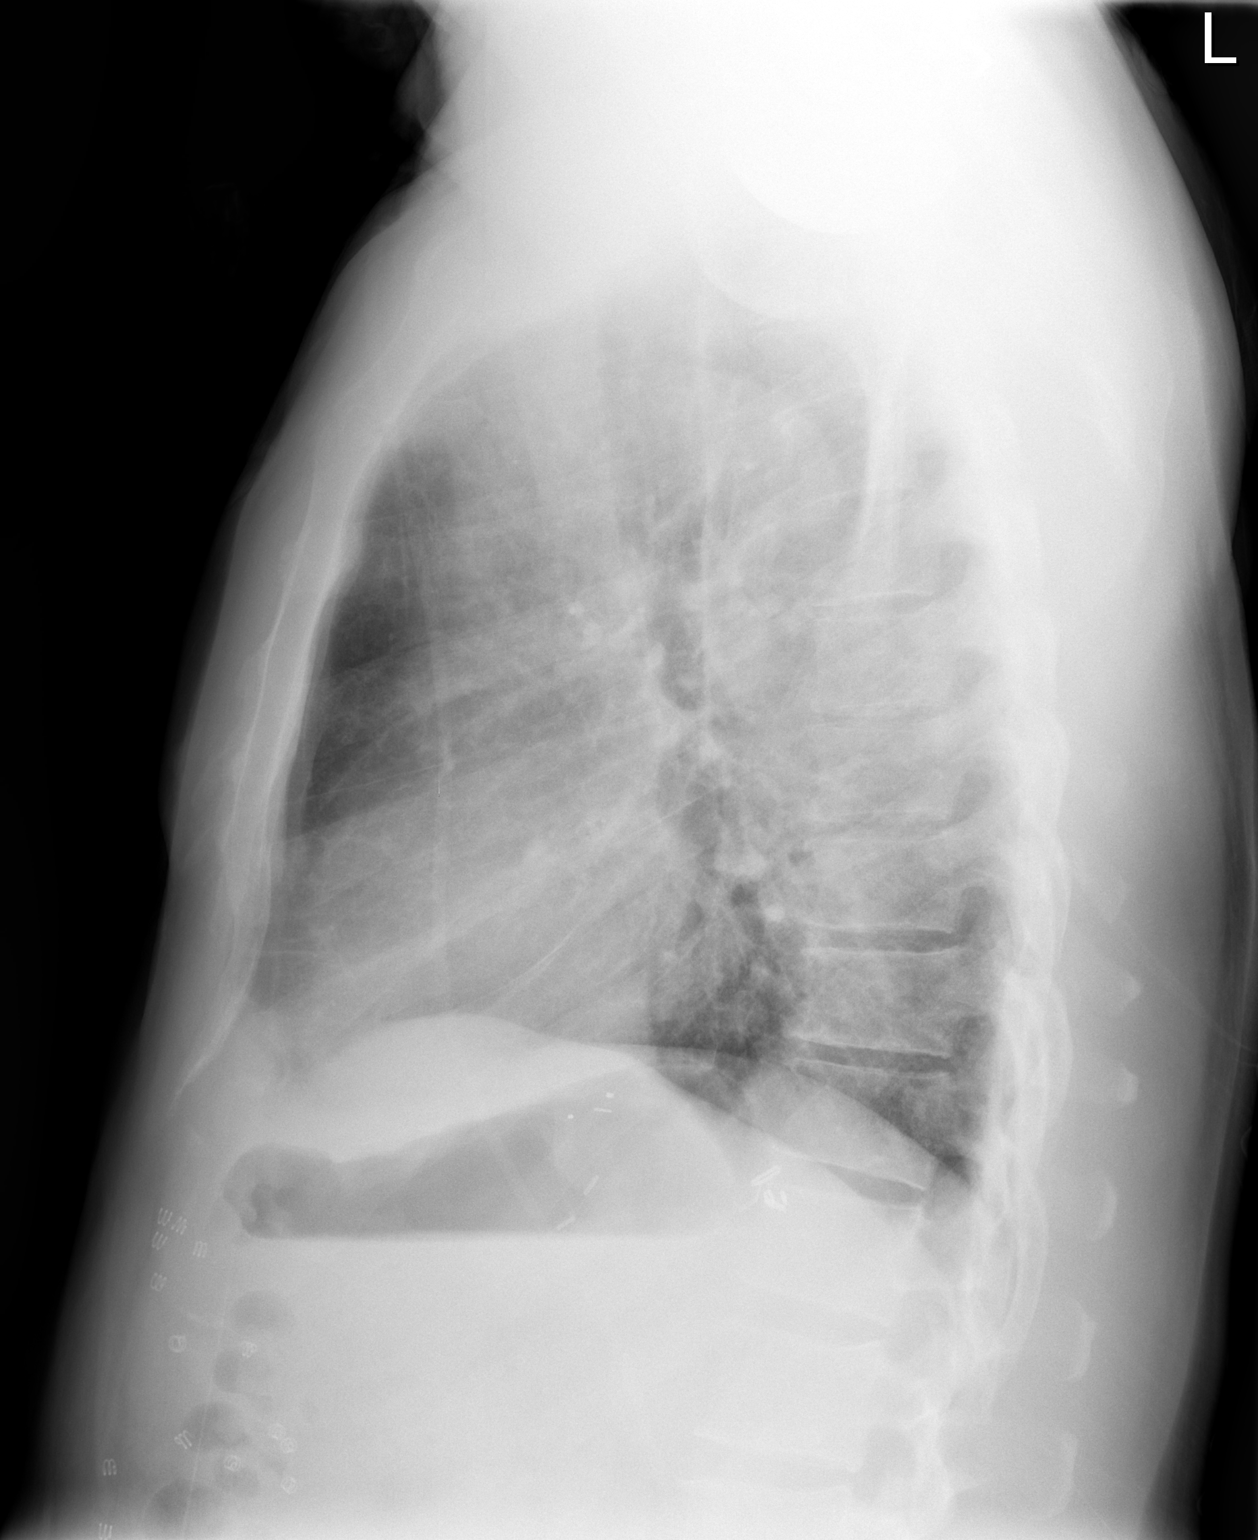

[2 of 2 positions shown; findings below may reference images not displayed]

FINDINGS: Upper normal heart size.
Normal mediastinal contours and pulmonary vascularity.
Vague density projects over the lower left chest, question subtle
infiltrate.
Minimal chronic accentuation of right basilar markings stable.
Remaining lungs clear.
No pleural effusion or pneumothorax.
Right humeral prosthesis.
Prior resection or resorption of distal right clavicle.
No fractures identified.
IMPRESSION: Questionable focus of infiltrate in lower left lung.

## 2014-10-13 NOTE — Op Note (Signed)
PATIENT NAME:  [name], Brewster J MR#:  [id number] DATE OF BIRTH:  1946/03/13  DATE OF PROCEDURE:  10/14/2011  PREOPERATIVE DIAGNOSIS: Chronic sinusitis.   POSTOPERATIVE DIAGNOSIS: Chronic sinusitis.   PROCEDURES:  1. Image guided sinus surgery (Stryker navigation).  2. Bilateral frontal sinusotomy with tissue removal.  3. Bilateral anterior and posterior ethmoidectomy with tissue removal.  4. Bilateral sphenoidectomy with tissue removal.  5. Bilateral maxillary sinus antrostomies with tissue removal.   SURGEON: Zackery BarefootJ. Madison Terryl Molinelli, MD  ANESTHESIA: General endotracheal.   COMPLICATIONS: No complications.   ESTIMATED BLOOD LOSS: 25 mL.   FINDINGS: There was mucopurulence emanating from the ostiomeatal complexes at the beginning of the case. This was suctioned clear. There was significant mucoperiosteal thickening throughout the ethmoid, frontal recess, sphenoid and ostiomeatal complexes.  DESCRIPTION OF PROCEDURE:  The image-guided sinus surgery system mask was attached and registration was carried out in the standard fashion using the appropriate fiduciary points. Calibration of the system was confirmed and extensive review of the CT scan in all three dimensions preoperatively and intraoperatively was carried out.  Each instrument was registered and confirmed for anatomic accuracy.  Attention was directed to the sinus surgery portion of the surgery. The left side was addressed first. The middle turbinate was taken down subtotally with a through-cutting forceps. The uncinate process was then identified and completely resected revealing the natural maxillary sinus ostium. The natural maxillary sinus ostium was progressively enlarged to create a large maxillary antrostomy, removing diseased tissue. The maxillary antrum was then irrigated copiously with saline. Attention was directed to the ethmoid sinuses where the ethmoid sinuses were taken down from anterior to posterior preserving the skull base  and the lamina papyracea, removing diseased tissue. After the ethmoid sinuses had been completely cleaned out of polypoid chronically inflamed mucosa preserving the mucosa on the medial side of the middle turbinate along the skull base and along the lamina papyracea, attention was directed to the sphenoid recess. The sphenoid sinus ostium was identified and progressively enlarged with Kerrison Rongeur, staying inferior and medial in orientation, to create a large sphenoid sinus antrostomy, removing diseased tissue. The 30 degree scope was then used to explore the frontal recess. The frontal recess was progressively enlarged meticulously, removing diseased tissue, and the frontal sinus was then copiously irrigated with saline as was the sphenoid sinus. Attention was then directed to the right side where an identical series of procedures was accomplished. Upon completion of the sinus surgery, the Surgiflo was placed. A total of 1 unit of Surgiflo was used.  Temporary Telfa pledgets were placed and tied over the columella to prevent dislodging.   The patient was then returned to anesthesia, allowed to emerge from anesthesia in the operating room, extubated, and taken to the recovery room in stable condition.  ____________________________ Shela CommonsJ. Gertie BaronMadison Amadi Yoshino, MD jmc:cms D: 10/15/2011 05:33:01 ET T: 10/15/2011 10:26:20 ET JOB#: [id number]  cc: Zackery BarefootJ. Madison Evan Mackie, MD, <Dictator> Wendee CoppJMADISON Elmore Hyslop MD ELECTRONICALLY SIGNED 10/20/2011 18:28
